# Patient Record
Sex: Male | Born: 1985 | Race: White | Marital: Single | Smoking: Never smoker
Health system: Southern US, Community
[De-identification: ages and names within clinical notes are randomized; demographics above are authoritative.]

## PROBLEM LIST (undated history)

## (undated) HISTORY — PX: KNEE SURGERY: SHX244

---

## 2004-03-08 ENCOUNTER — Ambulatory Visit: Payer: Self-pay | Admitting: General Surgery

## 2014-07-20 ENCOUNTER — Emergency Department: Payer: No Typology Code available for payment source

## 2014-07-20 ENCOUNTER — Encounter: Payer: Self-pay | Admitting: Emergency Medicine

## 2014-07-20 ENCOUNTER — Emergency Department
Admission: EM | Admit: 2014-07-20 | Discharge: 2014-07-20 | Disposition: A | Discharge status: Home / Self Care | Payer: No Typology Code available for payment source | Attending: Emergency Medicine | Admitting: Emergency Medicine

## 2014-07-20 DIAGNOSIS — S29012A Strain of muscle and tendon of back wall of thorax, initial encounter: Secondary | ICD-10-CM | POA: Insufficient documentation

## 2014-07-20 DIAGNOSIS — Z880 Allergy status to penicillin: Secondary | ICD-10-CM | POA: Diagnosis not present

## 2014-07-20 DIAGNOSIS — Y9389 Activity, other specified: Secondary | ICD-10-CM | POA: Insufficient documentation

## 2014-07-20 DIAGNOSIS — S39012A Strain of muscle, fascia and tendon of lower back, initial encounter: Secondary | ICD-10-CM | POA: Insufficient documentation

## 2014-07-20 DIAGNOSIS — Y998 Other external cause status: Secondary | ICD-10-CM | POA: Insufficient documentation

## 2014-07-20 DIAGNOSIS — S199XXA Unspecified injury of neck, initial encounter: Secondary | ICD-10-CM | POA: Diagnosis present

## 2014-07-20 DIAGNOSIS — Y92410 Unspecified street and highway as the place of occurrence of the external cause: Secondary | ICD-10-CM | POA: Diagnosis not present

## 2014-07-20 DIAGNOSIS — S29019A Strain of muscle and tendon of unspecified wall of thorax, initial encounter: Secondary | ICD-10-CM

## 2014-07-20 DIAGNOSIS — S161XXA Strain of muscle, fascia and tendon at neck level, initial encounter: Secondary | ICD-10-CM | POA: Diagnosis not present

## 2014-07-20 MED ORDER — OXYCODONE-ACETAMINOPHEN 5-325 MG PO TABS
ORAL_TABLET | ORAL | Status: AC
  Filled 2014-07-20: qty 1

## 2014-07-20 MED ORDER — OXYCODONE-ACETAMINOPHEN 5-325 MG PO TABS: 1.0000 | ORAL_TABLET | Freq: Three times a day (TID) | ORAL | Status: AC | PRN

## 2014-07-20 MED ORDER — OXYCODONE-ACETAMINOPHEN 5-325 MG PO TABS
1.0000 | ORAL_TABLET | Freq: Once | ORAL | Status: AC
  Administered 2014-07-20: 1 via ORAL

## 2014-07-20 MED ORDER — IBUPROFEN 800 MG PO TABS: 800.0000 mg | ORAL_TABLET | Freq: Three times a day (TID) | ORAL | Status: AC | PRN
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
## 2014-08-11 ENCOUNTER — Encounter: Payer: Self-pay | Admitting: Emergency Medicine

## 2014-08-11 ENCOUNTER — Emergency Department: Payer: Self-pay

## 2014-08-11 ENCOUNTER — Other Ambulatory Visit: Payer: Self-pay

## 2014-08-11 ENCOUNTER — Emergency Department
Admission: EM | Admit: 2014-08-11 | Discharge: 2014-08-11 | Disposition: A | Discharge status: Home / Self Care | Payer: Self-pay | Attending: Emergency Medicine | Admitting: Emergency Medicine

## 2014-08-11 DIAGNOSIS — R1010 Upper abdominal pain, unspecified: Secondary | ICD-10-CM | POA: Insufficient documentation

## 2014-08-11 DIAGNOSIS — R079 Chest pain, unspecified: Secondary | ICD-10-CM | POA: Insufficient documentation

## 2014-08-11 LAB — COMPREHENSIVE METABOLIC PANEL
ALT: 43 U/L (ref 17–63)
AST: 26 U/L (ref 15–41)
Albumin: 4.2 g/dL (ref 3.5–5.0)
Alkaline Phosphatase: 75 U/L (ref 38–126)
Anion gap: 8 (ref 5–15)
BUN: 12 mg/dL (ref 6–20)
CALCIUM: 9 mg/dL (ref 8.9–10.3)
CO2: 26 mmol/L (ref 22–32)
Chloride: 104 mmol/L (ref 101–111)
Creatinine, Ser: 1.01 mg/dL (ref 0.61–1.24)
GFR calc Af Amer: >60 mL/min (ref >60)
Glucose, Bld: 101 mg/dL — ABNORMAL HIGH (ref 65–99)
POTASSIUM: 4 mmol/L (ref 3.5–5.1)
SODIUM: 138 mmol/L (ref 135–145)
TOTAL PROTEIN: 7.6 g/dL (ref 6.5–8.1)
Total Bilirubin: 0.6 mg/dL (ref 0.3–1.2)

## 2014-08-11 LAB — CBC
HCT: 43.7 % (ref 40.0–52.0)
Hemoglobin: 15 g/dL (ref 13.0–18.0)
MCH: 30.9 pg (ref 26.0–34.0)
MCHC: 34.3 g/dL (ref 32.0–36.0)
MCV: 90.1 fL (ref 80.0–100.0)
PLATELETS: 253 10*3/uL (ref 150–440)
RBC: 4.85 MIL/uL (ref 4.40–5.90)
RDW: 13.3 % (ref 11.5–14.5)
WBC: 6.3 10*3/uL (ref 3.8–10.6)

## 2014-08-11 LAB — TROPONIN I
Troponin I: <0.03 ng/mL (ref <0.031)
Troponin I: <0.03 ng/mL (ref <0.031)

## 2014-08-11 MED ORDER — RANITIDINE HCL 150 MG PO TABS: 150.0000 mg | ORAL_TABLET | Freq: Two times a day (BID) | ORAL | Status: AC

## 2014-08-11 MED ORDER — GI COCKTAIL ~~LOC~~
30.0000 mL | Freq: Once | ORAL | Status: AC
  Administered 2014-08-11: 30 mL via ORAL
  Filled 2014-08-11: qty 30

## 2014-08-11 MED ORDER — SUCRALFATE 1 G PO TABS: 1.0000 g | ORAL_TABLET | Freq: Four times a day (QID) | ORAL | Status: AC

## 2014-08-11 NOTE — ED Notes (Signed)
Patient family history of father first MI at age 30

## 2014-08-11 NOTE — ED Provider Notes (Signed)
[facility] Emergency Department Provider Note   ____________________________________________  Time seen: 0930  I have reviewed the triage vital signs and the nursing notes.   HISTORY  Chief Complaint Chest Pain   History limited by: Not Limited   HPI Earl Morales. is a 29 y.o. male with no significant past medical history presents to the emergency department today because of concerns for chest pain. Patient states the chest pain woke him up roughly 3 hours ago. He describes the pain as being located in his upper mid abdomen and lower chest. He denies any radiation into his neck and arms. He describes it as being a tight feeling. He states he does have some discomfort in his back. He denies any shortness of breath with this denies any diaphoresis with this. He denies having similar symptoms in the past. No recent fevers, nausea or vomiting or other concerning medical symptoms.   History reviewed. No pertinent past medical history.  There are no active problems to display for this patient.   Past Surgical History  Procedure Laterality Date  . Knee surgery      Current Outpatient Rx  Name  Route  Sig  Dispense  Refill  . ibuprofen (ADVIL,MOTRIN) 800 MG tablet   Oral   Take 1 tablet (800 mg total) by mouth every 8 (eight) hours as needed for mild pain or moderate pain.   15 tablet   0   . oxyCODONE-acetaminophen (ROXICET) 5-325 MG per tablet   Oral   Take 1 tablet by mouth every 8 (eight) hours as needed for moderate pain or severe pain (Do not drive or operate heavy machinery while taking as can cause drowsiness.).   9 tablet   0     Allergies Penicillins  No family history on file.  Social History History  Substance Use Topics  . Smoking status: Never Smoker   . Smokeless tobacco: Not on file  . Alcohol Use: Yes     Comment: occassional    Review of Systems  Constitutional: Negative for fever. Cardiovascular: Positive for  chest pain. Respiratory: Negative for shortness of breath. Gastrointestinal: Positive for upper abdominal pain Genitourinary: Negative for dysuria. Musculoskeletal: Negative for back pain. Skin: Negative for rash. Neurological: Negative for headaches, focal weakness or numbness.  10-point ROS otherwise negative.  ____________________________________________   PHYSICAL EXAM:  VITAL SIGNS: ED Triage Vitals  Enc Vitals Group     BP 08/11/14 0852 125/86 mmHg     Pulse Rate 08/11/14 0852 75     Resp 08/11/14 0852 20     Temp 08/11/14 0852 97.7 F (36.5 C)     Temp Source 08/11/14 0852 Oral     SpO2 08/11/14 0852 97 %     Weight 08/11/14 0852 230 lb (104.327 kg)     Height 08/11/14 0852 6' (1.829 m)     Head Cir --      Peak Flow --      Pain Score 08/11/14 0853 8   Constitutional: Alert and oriented. Well appearing and in no distress. Eyes: Conjunctivae are normal. PERRL. Normal extraocular movements. ENT   Head: Normocephalic and atraumatic.   Nose: No congestion/rhinnorhea.   Mouth/Throat: Mucous membranes are moist.   Neck: No stridor. Hematological/Lymphatic/Immunilogical: No cervical lymphadenopathy. Cardiovascular: Normal rate, regular rhythm.  No murmurs, rubs, or gallops. Respiratory: Normal respiratory effort without tachypnea nor retractions. Breath sounds are clear and equal bilaterally. No wheezes/rales/rhonchi. Gastrointestinal: Soft and nontender. No distention.  Genitourinary:  Deferred Musculoskeletal: Normal range of motion in all extremities. No joint effusions.  No lower extremity tenderness nor edema. Neurologic:  Normal speech and language. No gross focal neurologic deficits are appreciated. Speech is normal.  Skin:  Skin is warm, dry and intact. No rash noted. Psychiatric: Mood and affect are normal. Speech and behavior are normal. Patient exhibits appropriate insight and judgment.  ____________________________________________    LABS  (pertinent positives/negatives)  Labs Reviewed  COMPREHENSIVE METABOLIC PANEL - Abnormal; Notable for the following:    Glucose, Bld 101 (*)    All other components within normal limits  CBC  TROPONIN I  TROPONIN I     ____________________________________________   EKG  I, Phineas Semen, attending physician, personally viewed and interpreted this EKG  EKG Time: 0849 Rate: 82 Rhythm: Normal Sinus Rhythm Axis: normal Intervals: qtc 432 QRS: incomplete RBBB ST changes: j point elevation V2 and V3    ____________________________________________    RADIOLOGY  CXR  IMPRESSION: Bibasilar atelectasis. No active disease.  ____________________________________________   PROCEDURES  Procedure(s) performed: None  Critical Care performed: No  ____________________________________________   INITIAL IMPRESSION / ASSESSMENT AND PLAN / ED COURSE  Pertinent labs & imaging results that were available during my care of the patient were reviewed by me and considered in my medical decision making (see chart for details).  Patient presented to the emergency department today because of concerns for chest pain. 2 sets of troponin negative. Patient did feel improved after GI cocktail. I do think likely patient's pain secondary to gastritis. I did advise patient however to follow-up with the [facility] cardiology given early family history of heart disease.  ____________________________________________   FINAL CLINICAL IMPRESSION(S) / ED DIAGNOSES  Final diagnoses:  Chest pain, unspecified chest pain type     Phineas Semen, MD 08/11/14 1252

## 2014-08-11 NOTE — ED Notes (Signed)
Awoke this am with lower chest pain, states feels like a band across his lower chest, states does increase with inspiration

## 2014-08-11 NOTE — Discharge Instructions (Signed)
Please seek medical attention for any high fevers, chest pain, shortness of breath, change in behavior, persistent vomiting, bloody stool or any other new or concerning symptoms. ° °Chest Pain (Nonspecific) °It is often hard to give a specific diagnosis for the cause of chest pain. There is always a chance that your pain could be related to something serious, such as a heart attack or a blood clot in the lungs. You need to follow up with your health care provider for further evaluation. °CAUSES  °· Heartburn. °· Pneumonia or bronchitis. °· Anxiety or stress. °· Inflammation around your heart (pericarditis) or lung (pleuritis or pleurisy). °· A blood clot in the lung. °· A collapsed lung (pneumothorax). It can develop suddenly on its own (spontaneous pneumothorax) or from trauma to the chest. °· Shingles infection (herpes zoster virus). °The chest wall is composed of bones, muscles, and cartilage. Any of these can be the source of the pain. °· The bones can be bruised by injury. °· The muscles or cartilage can be strained by coughing or overwork. °· The cartilage can be affected by inflammation and become sore (costochondritis). °DIAGNOSIS  °Lab tests or other studies may be needed to find the cause of your pain. Your health care provider may have you take a test called an ambulatory electrocardiogram (ECG). An ECG records your heartbeat patterns over a 24-hour period. You may also have other tests, such as: °· Transthoracic echocardiogram (TTE). During echocardiography, sound waves are used to evaluate how blood flows through your heart. °· Transesophageal echocardiogram (TEE). °· Cardiac monitoring. This allows your health care provider to monitor your heart rate and rhythm in real time. °· Holter monitor. This is a portable device that records your heartbeat and can help diagnose heart arrhythmias. It allows your health care provider to track your heart activity for several days, if needed. °· Stress tests by  exercise or by giving medicine that makes the heart beat faster. °TREATMENT  °· Treatment depends on what may be causing your chest pain. Treatment may include: °¨ Acid blockers for heartburn. °¨ Anti-inflammatory medicine. °¨ Pain medicine for inflammatory conditions. °¨ Antibiotics if an infection is present. °· You may be advised to change lifestyle habits. This includes stopping smoking and avoiding alcohol, caffeine, and chocolate. °· You may be advised to keep your head raised (elevated) when sleeping. This reduces the chance of acid going backward from your stomach into your esophagus. °Most of the time, nonspecific chest pain will improve within 2-3 days with rest and mild pain medicine.  °HOME CARE INSTRUCTIONS  °· If antibiotics were prescribed, take them as directed. Finish them even if you start to feel better. °· For the next few days, avoid physical activities that bring on chest pain. Continue physical activities as directed. °· Do not use any tobacco products, including cigarettes, chewing tobacco, or electronic cigarettes. °· Avoid drinking alcohol. °· Only take medicine as directed by your health care provider. °· Follow your health care provider's suggestions for further testing if your chest pain does not go away. °· Keep any follow-up appointments you made. If you do not go to an appointment, you could develop lasting (chronic) problems with pain. If there is any problem keeping an appointment, call to reschedule. °SEEK MEDICAL CARE IF:  °· Your chest pain does not go away, even after treatment. °· You have a rash with blisters on your chest. °· You have a fever. °SEEK IMMEDIATE MEDICAL CARE IF:  °· You have increased chest   pain or pain that spreads to your arm, neck, jaw, back, or abdomen. °· You have shortness of breath. °· You have an increasing cough, or you cough up blood. °· You have severe back or abdominal pain. °· You feel nauseous or vomit. °· You have severe weakness. °· You  faint. °· You have chills. °This is an emergency. Do not wait to see if the pain will go away. Get medical help at once. Call your local emergency services (911 in [place] Do not drive yourself to the hospital. °MAKE SURE YOU:  °· Understand these instructions. °· Will watch your condition. °· Will get help right away if you are not doing well or get worse. °Document Released: 10/12/2004 Document Revised: 01/07/2013 Document Reviewed: 08/08/2007 °[employer]® Patient Information ©2015 [employer]. This information is not intended to replace advice given to you by your health care provider. Make sure you discuss any questions you have with your health care provider. ° °
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
